# Patient Record
Sex: Female | Born: 2000 | Race: White | Hispanic: No | Marital: Single | Smoking: Never smoker
Health system: Southern US, Community
[De-identification: ages and names within clinical notes are randomized; demographics above are authoritative.]

## PROBLEM LIST (undated history)

## (undated) DIAGNOSIS — J45909 Unspecified asthma, uncomplicated: Secondary | ICD-10-CM

## (undated) DIAGNOSIS — R519 Headache, unspecified: Secondary | ICD-10-CM

## (undated) DIAGNOSIS — R51 Headache: Secondary | ICD-10-CM

## (undated) HISTORY — DX: Headache: R51

## (undated) HISTORY — DX: Headache, unspecified: R51.9

## (undated) HISTORY — PX: TYMPANOSTOMY: SHX2586

---
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
## 2021-01-18 ENCOUNTER — Ambulatory Visit (INDEPENDENT_AMBULATORY_CARE_PROVIDER_SITE_OTHER): Payer: Managed Care, Other (non HMO) | Admitting: Pediatrics

## 2021-01-29 ENCOUNTER — Other Ambulatory Visit: Payer: Self-pay

## 2021-01-29 ENCOUNTER — Ambulatory Visit
Admission: RE | Admit: 2021-01-29 | Discharge: 2021-01-29 | Disposition: A | Discharge status: Home / Self Care | Payer: Managed Care, Other (non HMO) | Source: Ambulatory Visit | Attending: Family Medicine | Admitting: Family Medicine

## 2021-01-29 VITALS — BP 106/70 | HR 78 | Temp 99.1°F | Resp 18 | Ht 61.0 in | Wt 115.0 lb

## 2021-01-29 DIAGNOSIS — J039 Acute tonsillitis, unspecified: Secondary | ICD-10-CM

## 2021-01-29 DIAGNOSIS — J029 Acute pharyngitis, unspecified: Secondary | ICD-10-CM | POA: Diagnosis not present

## 2021-01-29 MED ORDER — AMOXICILLIN 875 MG PO TABS: 875.0000 mg | ORAL_TABLET | Freq: Two times a day (BID) | ORAL | 0 refills | Status: DC

## 2021-01-29 MED ORDER — LIDOCAINE VISCOUS HCL 2 % MT SOLN: 15.0000 mL | OROMUCOSAL | 0 refills | Status: DC | PRN

## 2021-01-29 NOTE — Discharge Instructions (Addendum)
Complete entire course of treatment.  Treating you for tonsillitis and acute pharyngitis which given appearance appears to be strep. Gargle as needed with lidocaine viscous and warm water to help soothe throat pain.  Also recommend eating yogurt to help prevent yeast which can be associated with taking a antibiotic. If you are sexually active use barrier protection while taking an antibiotic as this can decrease the effectiveness of your oral birth birth control pills.

## 2021-01-29 NOTE — ED Triage Notes (Addendum)
Pt c/o swollen lymph nodes in her neck, under jaws, for several months starting with left and now including right. Pt also reports sore throat, pain with swallowing/speaking and swollen tonsils. Pt states she had several episodes of near-syncope yesterday, this is a new symptom. Pt recently had COVID and has improved. Pt denies f/n/v/d or other symptoms.

## 2021-01-29 NOTE — ED Provider Notes (Signed)
[facility]    CSN: [id number] Arrival date & time: 01/29/21  1146      History   Chief Complaint Chief Complaint  Patient presents with  . Sore Throat    HPI Maria Foley is a 20 y.o. female.   HPI Patient presents today with sore throat and cervical adenopathy mostly on the left side.  She reports soreness of throat is been present for the last 2 days and appeared to worsen over night last night.  She has a low-grade temp on arrival of 99.1.  Patient is status post a COVID-19 infection over 4 days ago.  She endorses pain with speaking and swallowing.  During her Covid infection she did not have any symptoms or of sore throat.  She has a history of chronic sinus rhinitis although currently is not experiencing any other symptoms.  Denies any nausea or vomitus Past Medical History:  Diagnosis Date  . Asthma   . Headache     Patient Active Problem List   Diagnosis Date Noted  . Hypersomnia with long sleep time, idiopathic 12/06/2020  . Excessive daytime sleepiness 12/06/2020  . Idiopathic hypersomnia 10/12/2020  . Orthostatic hypotension 10/12/2020  . Syncope 10/12/2020  . Neck pain 08/18/2020  . Migraine without aura and without status migrainosus, not intractable 10/06/2018  . Episodic tension-type headache, not intractable 10/06/2018  . Acute neck sprain, initial encounter 10/06/2018    Past Surgical History:  Procedure Laterality Date  . TYMPANOSTOMY      OB History   No obstetric history on file.      Home Medications    Prior to Admission medications   Medication Sig Start Date End Date Taking? Authorizing Provider  amoxicillin (AMOXIL) 875 MG tablet Take 1 tablet (875 mg total) by mouth 2 (two) times daily. 01/29/21  Yes Bing Neighbors, FNP  BALCOLTRA 0.1-20 MG-MCG(21) TABS Take 1 tablet by mouth daily. 07/20/20  Yes [provider]  FLUoxetine (PROZAC) 40 MG capsule Take 40 mg by mouth daily. 07/20/20  Yes [provider]  lamoTRIgine (LAMICTAL) 200 MG tablet Take 200 mg by mouth daily. 09/20/20  Yes [provider]  levocetirizine (XYZAL) 5 MG tablet  10/07/14  Yes [provider]  lidocaine (XYLOCAINE) 2 % solution Use as directed 15 mLs in the mouth or throat every 3 (three) hours as needed for mouth pain (Mix with warm water gargle and spit as needed for throat pain). 01/29/21  Yes Bing Neighbors, FNP  modafinil (PROVIGIL) 100 MG tablet Take 1 tablet (100 mg total) by mouth daily. 12/06/20  Yes Dohmeier, Porfirio Mylar, MD  Riboflavin-Magnesium-Feverfew (MIGRELIEF) 200-180-50 MG TABS Take 2 tablets daily 08/18/20  Yes Hickling, Deanna Artis, MD  SUMAtriptan (IMITREX) 25 MG tablet Take 1 tablet at onset of migraine with 400 mg of ibuprofen, may repeat in 2 hours if headache persists or recurs. 08/18/20  Yes Deetta Perla, MD  tiZANidine (ZANAFLEX) 2 MG tablet Take 1 tablet as needed for headache or neck pain 08/18/20  Yes Deetta Perla, MD    Family History Family History  Problem Relation Age of Onset  . Migraines Mother   . Migraines Sister   . Migraines Maternal Grandmother   . Other Maternal Grandfather   . Heart attack Paternal Grandmother   . Migraines Maternal Uncle     Social History Social History   Tobacco Use  . Smoking status: Never Smoker  . Smokeless tobacco: Never Used  Vaping Use  .  Vaping Use: Never used  Substance Use Topics  . Alcohol use: No    Alcohol/week: 0.0 standard drinks  . Drug use: No     Allergies   Patient has no known allergies.   Review of Systems Review of Systems Pertinent negatives listed in HPI   Physical Exam Triage Vital Signs ED Triage Vitals  Enc Vitals Group     BP 01/29/21 1224 106/70     Pulse Rate 01/29/21 1224 78     Resp 01/29/21 1224 18     Temp 01/29/21 1224 99.1 F (37.3 C)     Temp Source 01/29/21 1224 Oral     SpO2 01/29/21 1224 97 %     Weight 01/29/21 1222 115 lb (52.2 kg)     Height  01/29/21 1222 5\' 1"  (1.549 m)     Head Circumference --      Peak Flow --      Pain Score 01/29/21 1222 9     Pain Loc --      Pain Edu? --      Excl. in GC? --    No data found.  Updated Vital Signs BP 106/70 (BP Location: Left Arm)   Pulse 78   Temp 99.1 F (37.3 C) (Oral)   Resp 18   Ht 5\' 1"  (1.549 m)   Wt 115 lb (52.2 kg)   LMP 12/11/2020 (Approximate)   SpO2 97%   BMI 21.73 kg/m   Visual Acuity Right Eye Distance:   Left Eye Distance:   Bilateral Distance:    Right Eye Near:   Left Eye Near:    Bilateral Near:     Physical Exam  General Appearance:    Alert, cooperative, no distress  HENT:   Normocephalic, ears normal, nares patent, oropharynx erythematous with exudate, AND +3 tonsillar enlargement     Eyes:    PERRL, conjunctiva/corneas clear, EOM's intact       Lungs:     Clear to auscultation bilaterally, respirations unlabored  Heart:    Regular rate and rhythm  Neurologic:   Awake, alert, oriented x 3. No apparent focal neurological           defect.     UC Treatments / Results  Labs (all labs ordered are listed, but only abnormal results are displayed) Labs Reviewed - No data to display  EKG   Radiology No results found.  Procedures Procedures (including critical care time)  Medications Ordered in UC Medications - No data to display  Initial Impression / Assessment and Plan / UC Course  I have reviewed the triage vital signs and the nursing notes.  Pertinent labs & imaging results that were available during my care of the patient were reviewed by me and considered in my medical decision making (see chart for details).    Acute pharyngitis and acute tonsillitis will cover with amoxicillin 875 twice daily for the next 10 days.  As needed lidocaine viscous with warm water gargles as needed for throat pain. Patient is status post COVID over 4 weeks ago current symptoms present for 2 days.  Exam findings is consistent with that of a likely  streptococcal infection.  Given that I am treating for acute pharyngitis opted against rapid strep.  If symptoms worsen or do not improve patient advised to follow-up. Final Clinical Impressions(s) / UC Diagnoses   Final diagnoses:  Acute pharyngitis, unspecified etiology  Acute tonsillitis, unspecified etiology     Discharge Instructions  Complete entire course of treatment.  Treating you for tonsillitis and acute pharyngitis which given appearance appears to be strep. Gargle as needed with lidocaine viscous and warm water to help soothe throat pain.  Also recommend eating yogurt to help prevent yeast which can be associated with taking a antibiotic. If you are sexually active use barrier protection while taking an antibiotic as this can decrease the effectiveness of your oral birth birth control pills.   ED Prescriptions    Medication Sig Dispense Auth. Provider   amoxicillin (AMOXIL) 875 MG tablet Take 1 tablet (875 mg total) by mouth 2 (two) times daily. 20 tablet Bing Neighbors, FNP   lidocaine (XYLOCAINE) 2 % solution Use as directed 15 mLs in the mouth or throat every 3 (three) hours as needed for mouth pain (Mix with warm water gargle and spit as needed for throat pain). 50 mL Bing Neighbors, FNP     PDMP not reviewed this encounter.   Bing Neighbors, FNP 01/29/21 1315

## 2021-04-26 ENCOUNTER — Encounter (INDEPENDENT_AMBULATORY_CARE_PROVIDER_SITE_OTHER): Payer: Self-pay

## 2021-05-16 ENCOUNTER — Telehealth (INDEPENDENT_AMBULATORY_CARE_PROVIDER_SITE_OTHER): Payer: Self-pay | Admitting: Pediatrics

## 2021-05-16 DIAGNOSIS — G44219 Episodic tension-type headache, not intractable: Secondary | ICD-10-CM

## 2021-05-16 DIAGNOSIS — G4711 Idiopathic hypersomnia with long sleep time: Secondary | ICD-10-CM

## 2021-05-16 DIAGNOSIS — G43009 Migraine without aura, not intractable, without status migrainosus: Secondary | ICD-10-CM

## 2021-05-16 NOTE — Telephone Encounter (Signed)
Requesting transfer of care to Dr. Porfirio Mylar Dohmeier for treatment of idiopathic hypersomnia and mixture of migraine and tension type headaches successfully treated with Migrelief.

## 2021-06-27 ENCOUNTER — Ambulatory Visit (INDEPENDENT_AMBULATORY_CARE_PROVIDER_SITE_OTHER): Payer: Managed Care, Other (non HMO) | Admitting: Pediatrics

## 2021-08-30 ENCOUNTER — Other Ambulatory Visit: Payer: Self-pay

## 2021-08-30 ENCOUNTER — Encounter: Payer: Self-pay | Admitting: Neurology

## 2021-08-30 ENCOUNTER — Ambulatory Visit: Payer: Managed Care, Other (non HMO) | Admitting: Neurology

## 2021-08-30 VITALS — BP 102/52 | HR 74 | Ht 61.0 in | Wt 114.0 lb

## 2021-08-30 DIAGNOSIS — G4711 Idiopathic hypersomnia with long sleep time: Secondary | ICD-10-CM

## 2021-08-30 DIAGNOSIS — G4719 Other hypersomnia: Secondary | ICD-10-CM

## 2021-08-30 DIAGNOSIS — I951 Orthostatic hypotension: Secondary | ICD-10-CM

## 2021-08-30 NOTE — Progress Notes (Signed)
SLEEP MEDICINE CLINIC    Provider:  Melvyn Novas, MD  Primary Care Physician:  Clayborn Heron, MD [address] [place] [address]     Referring Provider: Deetta Perla, Md [address] [place],  [place] [address]          Chief Complaint according to patient   Patient presents with:     New Patient (Initial Visit)           HISTORY OF PRESENT ILLNESS:  Maria Foley is a 20 y.o. year old White or Caucasian female patient seen here as a referral on 08/30/2021 from Dr Sharene Skeans for a hypersomnia evaluation, with a history of depression.  Chief concern according to patient :  RM 10 alone here for consult on migraines and trouble sleeping. Reports she has trouble focusing and completing tasks for school. Reports this is an on going issue for her. Previously seen by Dr. Sharene Skeans. Has tried Modafinil for sleepiness and has sumatriptan PRN for migraine, modafinil made headache worse.   She self medication with caffeine, is still sleepy when driving, can sleep all night and all day. Takes daily naps. NO Vivid dreams. No sleep paralysis. No short dream latency.  LIFELONG HYPERSOMNIA> She was the good baby that always slept, she fell asleep in elementary school. Mother is a frequent napper and sleeps long hours at night.      I have the pleasure of seeing Maria Foley today, a right -handed White or Caucasian female with a possible sleep disorder.  She  has a past medical history of Asthma and Headache..    Sleep relevant medical history: No nocturia, but chronic migraines,    Family medical /sleep history: mother and sister with chronic migraines , non menstrual,  can last days- Photophobia , nausea, rare vomiting, no other family member on CPAP with OSA, with insomnia, sleep walkers.    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 Her pet cat is  living with her.   Tobacco use- never .  ETOH use - none ,  Caffeine intake in form of Coffee( 2 in AM ) Soda( dr P/ 2-3  week ) Tea ( /)  prn energy drinks. Regular exercise in form of hiking.  Hobbies :reading - but she always falls asleep    Sleep habits are as follows: The patient's dinner time is between 7 PM. The patient goes to bed at 10.30  PM and continues to sleep for 9-10 hours, wakes  rarely for any bathroom breaks, .   The preferred sleep position is variable , with the support of 2 pillows. Dreams are reportedly frequent/.  8  AM is the usual rise time. The patient wakes up with an alarm. MUTIPLE ALARMS.  She reports not feeling refreshed or restored in AM, with symptoms such as dry mouth, and residual fatigue. No AM headaches.  Naps are taken frequently, lasting hours and still do not alleviate refreshing than nocturnal sleep.    Review of Systems: Out of a complete 14 system review, the patient complains of only the following symptoms, and all other reviewed systems are negative.:  Fatigue, sleepiness , snoring,  HYPERSOMNIA, EDS, IDIOPATHIC HYPERSOMNIA   Used to have vivid dreams, but not since college started.    How likely are you to doze in the following situations: 0 = not likely, 1 =  slight chance, 2 = moderate chance, 3 = high chance   Sitting and Reading? Watching Television? Sitting inactive in a public place (theater or meeting)? As a passenger in a car for an hour without a break? Lying down in the afternoon when circumstances permit? Sitting and talking to someone? Sitting quietly after lunch without alcohol? In a car, while stopped for a few minutes in traffic?   Total = 21/ 24 points   FSS endorsed at 60/ 63 points.   Social History   Socioeconomic History   Marital status: Single    Spouse name: Not on file   Number of children: Not on file   Years of education: Not on file   Highest education level: Not on file  Occupational History   Not on  file  Tobacco Use   Smoking status: Never   Smokeless tobacco: Never  Vaping Use   Vaping Use: Never used  Substance and Sexual Activity   Alcohol use: No    Alcohol/week: 0.0 standard drinks   Drug use: No   Sexual activity: Not on file  Other Topics Concern   Not on file  Social History Narrative   Maria Foley is a high Garment/[employer] graduate.   She attended [employer].   She lives with both parents. She has one sister.   She enjoys school, church, and playing with her dogs.   Attends [employer]    Social Determinants of Health   Financial Resource Strain: Not on file  Food Insecurity: Not on file  Transportation Needs: Not on file  Physical Activity: Not on file  Stress: Not on file  Social Connections: Not on file    Family History  Problem Relation Age of Onset   Migraines Mother    Migraines Sister    Migraines Maternal Grandmother    Other Maternal Grandfather    Heart attack Paternal Grandmother    Migraines Maternal Uncle     Past Medical History:  Diagnosis Date   Asthma    Headache     Past Surgical History:  Procedure Laterality Date   TYMPANOSTOMY       Current Outpatient Medications on File Prior to Visit  Medication Sig Dispense Refill   FLUoxetine (PROZAC) 40 MG capsule Take 40 mg by mouth daily.     lamoTRIgine (LAMICTAL) 200 MG tablet Take 200 mg by mouth daily.     levocetirizine (XYZAL) 5 MG tablet   6   montelukast (SINGULAIR) 10 MG tablet Take 10 mg by mouth at bedtime.     SUMAtriptan (IMITREX) 25 MG tablet Take 1 tablet at onset of migraine with 400 mg of ibuprofen, may repeat in 2 hours if headache persists or recurs. 10 tablet 5   No current facility-administered medications on file prior to visit.    No Known Allergies  Physical exam:  Today's Vitals   08/30/21 1406  BP: (!) 102/52  Pulse: 74  SpO2: 97%  Weight: 114 lb (51.7 kg)  Height: 5\' 1"  (1.549 m)   Body mass index is 21.54 kg/m.   Wt Readings from Last 3  Encounters:  08/30/21 114 lb (51.7 kg) (23 %, Z= -0.74)*  01/29/21 115 lb (52.2 kg) (27 %, Z= -0.61)*  12/27/20 114 lb 6.4 oz (51.9 kg) (26 %, Z= -0.64)*   * Growth percentiles are based on CDC (Girls, 2-20 Years) data.     Ht Readings from Last 3 Encounters:  08/30/21 5\' 1"  (1.549 m) (10 %, Z= -  1.29)*  01/29/21 5\' 1"  (1.549 m) (10 %, Z= -1.28)*  12/27/20 5\' 1"  (1.549 m) (10 %, Z= -1.28)*   * Growth percentiles are based on CDC (Girls, 2-20 Years) data.      General: The patient is awake, alert and appears not in acute distress. The patient is well groomed. Head: Normocephalic, atraumatic. Neck is supple. Mallampati 1,  neck circumference:12.5  inches . Nasal airflow  patent.  Retrognathia is not  seen.  Dental status: retainer/  Cardiovascular:  Regular rate and cardiac rhythm by pulse,  without distended neck veins. Respiratory: Lungs are clear to auscultation.  Skin:  Without evidence of ankle edema, or rash. Trunk: The patient's posture is erect.   Neurologic exam : The patient is awake and alert, oriented to place and time.   Memory subjective described as intact.  Attention span & concentration ability appears normal.  Speech is fluent,  without  dysarthria, dysphonia or aphasia.  Mood and affect are appropriate.   Cranial nerves: no loss of smell or taste reported  Pupils are equal and briskly reactive to light. Funduscopic exam deferred. .  Extraocular movements in vertical and horizontal planes were intact and without nystagmus. No Diplopia. Visual fields by finger perimetry are intact. Hearing was intact to soft voice and finger rubbing.    Facial sensation intact to fine touch.  Facial motor strength is symmetric and tongue and uvula move midline.  Neck ROM : rotation, tilt and flexion extension were normal for age and shoulder shrug was symmetrical.    Motor exam:  Symmetric bulk, tone and ROM.   Normal tone without cog-wheeling, symmetric grip strength .    Sensory:  Fine touch, pinprick and vibration were tested  and  normal.  Proprioception tested in the upper extremities was normal.   Coordination: Rapid alternating movements in the fingers/hands were of normal speed.  The Finger-to-nose maneuver was intact without evidence of ataxia, dysmetria or tremor.   Gait and station: Patient could rise unassisted from a seated position, walked without assistive device.  Stance is of normal width/ base and the patient turned with 3 steps.  Toe and heel walk were deferred.  Deep tendon reflexes: in the  upper and lower extremities are symmetric and intact.  Babinski response was deferred .       After spending a total time of  45  minutes face to face and additional time for physical and neurologic examination, 15 minutes review of laboratory studies,  personal review of imaging studies, reports and results of other testing and review of referral information / records as far as provided in visit, I have established the following assessments:  1) severe HYPERSOMNIA with prolonged sleep time, without cataplexy, or hypnagogic hallucinations , sleep paralysis.  2)   day and night time sleep is excessive and prolonged, not fragmented. Snoring is present.     My Plan is to proceed with:  1) HST or PSG to rule out apnea. [employer] : this will be a HST and if negative will have to follow with PSG and MSLT.   2) The patient is on generic Prozac / Lamictal for mood- and would not wean off  ( no MSLT ) , can use a MWT instead?    I would like to thank Rankins, 02/24/21, MD and , Md [address] [place],  [address] [place] for allowing me to meet with and to take care of this pleasant patient.  In short, Maria Foley is presenting with hypersomnia,I plan to follow up either personally or through our NP within 2-4  month.   CC: I will share my notes with Dr Hickling/ .  Electronically signed by: Melvyn Novas,  MD 08/30/2021 2:28 PM  [facility] Board certified by The [employer] of Sleep Medicine and Diplomate of the [employer] of Sleep Medicine. Board certified In Neurology through the ABPN, Fellow of the [employer] of Neurology. Medical Director of [facility].

## 2021-08-30 NOTE — Patient Instructions (Signed)
Hypersomnia Hypersomnia is a condition in which a person feels very tired during the day even though he or she gets plenty of sleep at night. A person with this condition may take naps during the day and may find it very difficult to wake up from sleep. Hypersomnia may affect a person's ability to think, concentrate,drive, or remember things. What are the causes? The cause of this condition may not be known. Possible causes include: Certain medicines. Sleep disorders, such as narcolepsy and sleep apnea. Injury to the head, brain, or spinal cord. Drug or alcohol use. Gastroesophageal reflux disease (GERD). Tumors. Certain medical conditions, such as depression, diabetes, or an underactive thyroid gland (hypothyroidism). What are the signs or symptoms? The main symptoms of hypersomnia include: Feeling very tired throughout the day, regardless of how much sleep you got the night before. Having trouble waking up. Others may find it difficult to wake you up when you are sleeping. Sleeping for longer and longer periods at a time. Taking naps throughout the day. Other symptoms may include: Feeling restless, anxious, or annoyed. Lacking energy. Having trouble with: Remembering. Speaking. Thinking. Loss of appetite. Seeing, hearing, tasting, smelling, or feeling things that are not real (hallucinations). How is this diagnosed? This condition may be diagnosed based on: Your symptoms and medical history. Your sleeping habits. Your health care provider may ask you to write down your sleeping habits in a daily sleep log, along with any symptoms you have. A series of tests that are done while you sleep (sleep study or polysomnogram). A test that measures how quickly you can fall asleep during the day (daytime nap study or multiple sleep latency test). How is this treated? Treatment can help you manage your condition. Treatment may include: Following a regular sleep routine. Lifestyle changes,  such as changing your eating habits, getting regular exercise, and avoiding alcohol or caffeinated beverages. Taking medicines to make you more alert (stimulants) during the day. Treating any underlying medical causes of hypersomnia. Follow these instructions at home: Sleep routine  Schedule the same bedtime and wake-up time each day. Practice a relaxing bedtime routine. This may include reading, meditation, deep breathing, or taking a warm bath before going to sleep. Get regular exercise each day. Avoid strenuous exercise in the evening hours. Keep your sleep environment at a cooler temperature, darkened, and quiet. Sleep with pillows and a mattress that are comfortable and supportive. Schedule short 20-minute naps for when you feel sleepiest during the day. Talk with your employer or teachers about your hypersomnia. If possible, adjust your schedule so that: You have a regular daytime work schedule. You can take a scheduled nap during the day. You do not have to work or be active at night. Do not eat a heavy meal for a few hours before bedtime. Eat your meals at about the same times every day. Avoid drinking alcohol or caffeinated beverages.  Safety  Do not drive or use heavy machinery if you are sleepy. Ask your health care provider if it is safe for you to drive. Wear a life jacket when swimming or spending time near water.  General instructions Take supplements and over-the-counter and prescription medicines only as told by your health care provider. Keep a sleep log that will help your doctor manage your condition. This may include information about: What time you go to bed each night. How often you wake up at night. How many hours you sleep at night. How often and for how long you nap during the   day. Any observations from others, such as leg movements during sleep, sleep walking, or snoring. Keep all follow-up visits as told by your health care provider. This is  important. Contact a health care provider if: You have new symptoms. Your symptoms get worse. Get help right away if: You have serious thoughts about hurting yourself or someone else. If you ever feel like you may hurt yourself or others, or have thoughts about taking your own life, get help right away. You can go to your nearest emergency department or call: Your local emergency services (911 in the [place] A suicide crisis helpline, such as the National Suicide Prevention Lifeline at 1-800-273-8255. This is open 24 hours a day. Summary Hypersomnia refers to a condition in which you feel very tired during the day even though you get plenty of sleep at night. A person with this condition may take naps during the day and may find it very difficult to wake up from sleep. Hypersomnia may affect a person's ability to think, concentrate, drive, or remember things. Treatment, such as following a regular sleep routine and making some lifestyle changes, can help you manage your condition. This information is not intended to replace advice given to you by your health care provider. Make sure you discuss any questions you have with your healthcare provider. Document Revised: 10/19/2020 Document Reviewed: 10/19/2020 Elsevier Patient Education  2022 Elsevier Inc.  

## 2021-09-08 LAB — NARCOLEPSY EVALUATION
DQA1*01:02: NEGATIVE
DQB1*06:02: NEGATIVE

## 2021-09-11 ENCOUNTER — Telehealth: Payer: Self-pay | Admitting: *Deleted

## 2021-09-11 NOTE — Telephone Encounter (Signed)
-----   Message from Judi Cong, RN sent at 09/11/2021  7:46 AM EDT ----- Negative markers for genetic concern for narcolepsy ----- Message ----- From: [facility] Lab Results In Sent: 09/08/2021   5:37 AM EDT To: Melvyn Novas, MD

## 2021-09-11 NOTE — Telephone Encounter (Signed)
Called and LVM for pt about labs results per Dr. Oliva Bustard note. Advised she will be called soon to schedule home sleep test. Advised her to call back if she does not hear about getting scheduled for this in the next 1-2 weeks.

## 2021-09-14 ENCOUNTER — Telehealth: Payer: Self-pay

## 2021-09-14 NOTE — Telephone Encounter (Signed)
LVM for pt to call me back to schedule sleep study  

## 2021-09-25 ENCOUNTER — Telehealth: Payer: Self-pay

## 2021-09-25 NOTE — Telephone Encounter (Signed)
LVM for pt to call me back to schedule sleep study  

## 2021-10-01 ENCOUNTER — Telehealth: Payer: Self-pay

## 2021-10-01 NOTE — Telephone Encounter (Signed)
We have attempted to call the patient 2 times to schedule sleep study. Patient has been unavailable at the phone numbers we have on file and has not returned our calls. If patient calls back we will schedule them for their sleep study. ° °
